# Patient Record
Sex: Female | Born: 1970 | Race: White | Hispanic: No | Marital: Married | Smoking: Never smoker
Health system: Southern US, Community
[De-identification: ages and names within clinical notes are randomized; demographics above are authoritative.]

## PROBLEM LIST (undated history)

## (undated) DIAGNOSIS — S62617D Displaced fracture of proximal phalanx of left little finger, subsequent encounter for fracture with routine healing: Principal | ICD-10-CM

## (undated) DIAGNOSIS — E079 Disorder of thyroid, unspecified: Secondary | ICD-10-CM

## (undated) DIAGNOSIS — K219 Gastro-esophageal reflux disease without esophagitis: Secondary | ICD-10-CM

---

## 2003-12-08 HISTORY — PX: CHOLECYSTECTOMY: SHX55

## 2007-12-08 HISTORY — PX: ABDOMINAL HYSTERECTOMY: SHX81

## 2018-05-25 ENCOUNTER — Other Ambulatory Visit: Payer: Self-pay

## 2018-05-25 ENCOUNTER — Emergency Department (HOSPITAL_BASED_OUTPATIENT_CLINIC_OR_DEPARTMENT_OTHER)
Admission: EM | Admit: 2018-05-25 | Discharge: 2018-05-26 | Disposition: A | Discharge status: Home / Self Care | Payer: BLUE CROSS/BLUE SHIELD | Attending: Emergency Medicine | Admitting: Emergency Medicine

## 2018-05-25 ENCOUNTER — Encounter (HOSPITAL_BASED_OUTPATIENT_CLINIC_OR_DEPARTMENT_OTHER): Payer: Self-pay

## 2018-05-25 ENCOUNTER — Emergency Department (HOSPITAL_BASED_OUTPATIENT_CLINIC_OR_DEPARTMENT_OTHER): Payer: BLUE CROSS/BLUE SHIELD

## 2018-05-25 DIAGNOSIS — T17308A Unspecified foreign body in larynx causing other injury, initial encounter: Secondary | ICD-10-CM

## 2018-05-25 DIAGNOSIS — K449 Diaphragmatic hernia without obstruction or gangrene: Secondary | ICD-10-CM | POA: Insufficient documentation

## 2018-05-25 DIAGNOSIS — K222 Esophageal obstruction: Secondary | ICD-10-CM | POA: Diagnosis not present

## 2018-05-25 DIAGNOSIS — Z79899 Other long term (current) drug therapy: Secondary | ICD-10-CM | POA: Insufficient documentation

## 2018-05-25 DIAGNOSIS — T18128A Food in esophagus causing other injury, initial encounter: Secondary | ICD-10-CM | POA: Diagnosis not present

## 2018-05-25 DIAGNOSIS — R0989 Other specified symptoms and signs involving the circulatory and respiratory systems: Secondary | ICD-10-CM | POA: Diagnosis present

## 2018-05-25 DIAGNOSIS — X58XXXA Exposure to other specified factors, initial encounter: Secondary | ICD-10-CM | POA: Insufficient documentation

## 2018-05-25 DIAGNOSIS — E039 Hypothyroidism, unspecified: Secondary | ICD-10-CM | POA: Diagnosis not present

## 2018-05-25 DIAGNOSIS — Y929 Unspecified place or not applicable: Secondary | ICD-10-CM | POA: Diagnosis not present

## 2018-05-25 DIAGNOSIS — Y999 Unspecified external cause status: Secondary | ICD-10-CM | POA: Diagnosis not present

## 2018-05-25 DIAGNOSIS — Y9389 Activity, other specified: Secondary | ICD-10-CM | POA: Diagnosis not present

## 2018-05-25 HISTORY — DX: Disorder of thyroid, unspecified: E07.9

## 2018-05-25 HISTORY — DX: Gastro-esophageal reflux disease without esophagitis: K21.9

## 2018-05-25 NOTE — ED Notes (Signed)
Patient reports possible food bolus (steak); denies any SHOB; confirms emesis.

## 2018-05-25 NOTE — ED Triage Notes (Signed)
Pt thinks a piece of steak is stuck in throat, occurred around 1830, has not been able to eat or drink anything since then

## 2018-05-25 NOTE — ED Notes (Signed)
ED Provider at bedside. 

## 2018-05-25 NOTE — ED Provider Notes (Signed)
[facility] EMERGENCY DEPARTMENT Provider Note   CSN: [id number] Arrival date & time: 05/25/18  2157     History   Chief Complaint Chief Complaint  Patient presents with  . Swallowed Foreign Body    HPI Tanya Donovan is a 47 y.o. female.  The history is provided by the patient. No language interpreter was used.  Sore Throat  This is a new problem. The current episode started 1 to 2 hours ago. The problem occurs constantly. The problem has not changed since onset.Pertinent negatives include no chest pain, no abdominal pain, no headaches and no shortness of breath. Nothing aggravates the symptoms. Nothing relieves the symptoms. She has tried nothing for the symptoms. The treatment provided no relief.    Past Medical History:  Diagnosis Date  . GERD (gastroesophageal reflux disease)   . Thyroid disease    hypothyroidism    There are no active problems to display for this patient.   Past Surgical History:  Procedure Laterality Date  . ABDOMINAL HYSTERECTOMY  2009  . CESAREAN SECTION  1995  . CHOLECYSTECTOMY  2005     OB History   None      Home Medications    Prior to Admission medications   Medication Sig Start Date End Date Taking? Authorizing Provider  escitalopram (LEXAPRO) 10 MG tablet Take 10 mg by mouth daily.   Yes [provider]  levothyroxine (SYNTHROID, LEVOTHROID) 75 MCG tablet Take 75 mcg by mouth daily.   Yes [provider]    Family History No family history on file.  Social History Social History   Tobacco Use  . Smoking status: Never Smoker  . Smokeless tobacco: Never Used  Substance Use Topics  . Alcohol use: Yes    Comment: occasion  . Drug use: Not on file     Allergies   Patient has no known allergies.   Review of Systems Review of Systems  Constitutional: Negative for chills, fatigue and fever.  HENT: Positive for trouble swallowing. Negative for congestion.   Eyes: Negative for visual  disturbance.  Respiratory: Positive for choking. Negative for apnea, cough, chest tightness, shortness of breath, wheezing and stridor.   Cardiovascular: Negative for chest pain.  Gastrointestinal: Negative for abdominal pain, constipation, diarrhea, nausea and vomiting.  Genitourinary: Negative for dysuria, flank pain and frequency.  Musculoskeletal: Negative for back pain.  Skin: Negative for rash and wound.  Neurological: Negative for light-headedness, numbness and headaches.  All other systems reviewed and are negative.    Physical Exam Updated Vital Signs BP (!) 154/99 (BP Location: Left Arm)   Pulse 89   Temp 99.4 F (37.4 C) (Oral)   Resp 18   Ht 5\' 6"  (1.676 m)   Wt 104.3 kg (230 lb)   SpO2 98%   BMI 37.12 kg/m   Physical Exam  Constitutional: She is oriented to person, place, and time. She appears well-developed and well-nourished. No distress.  HENT:  Head: Normocephalic and atraumatic.  Nose: Nose normal.  Mouth/Throat: Oropharynx is clear and moist. No oropharyngeal exudate.  Eyes: No scleral icterus.  Neck: Normal range of motion. Neck supple.  Cardiovascular: Normal rate and regular rhythm.  No murmur heard. Pulmonary/Chest: Effort normal and breath sounds normal. No stridor. No respiratory distress. She has no rales. She exhibits no tenderness.  Abdominal: Soft. There is no tenderness.  Musculoskeletal: She exhibits no edema.  Lymphadenopathy:    She has no cervical adenopathy.  Neurological: She is alert and oriented  to person, place, and time.  Skin: Skin is warm and dry. No rash noted. She is not diaphoretic. No erythema.  Psychiatric: She has a normal mood and affect.  Nursing note and vitals reviewed.    ED Treatments / Results  Labs (all labs ordered are listed, but only abnormal results are displayed) Labs Reviewed - No data to display  EKG None  Radiology Dg Neck Soft Tissue  Result Date: 05/25/2018 CLINICAL DATA:  Choked on steak while  laughing. EXAM: NECK SOFT TISSUES - 1+ VIEW COMPARISON:  None. FINDINGS: There is no evidence of retropharyngeal soft tissue swelling or epiglottic enlargement. Abnormal soft tissue density at level of the larynx. No radiopaque foreign bodies. IMPRESSION: Soft tissue fullness at the larynx, potential foreign body. Recommend direct inspection. Acute findings discussed with and reconfirmed by Dr.CHRISTOPHER TEGELER on 05/25/2018 at 10:44 pm. Electronically Signed   By: Awilda Metro M.D.   On: 05/25/2018 22:44    Procedures Procedures (including critical care time)  Medications Ordered in ED Medications - No data to display   Initial Impression / Assessment and Plan / ED Course  I have reviewed the triage vital signs and the nursing notes.  Pertinent labs & imaging results that were available during my care of the patient were reviewed by me and considered in my medical decision making (see chart for details).     Tanya Donovan is a 47 y.o. female with a past medical history for thyroid disease who presents for food bolus.  Patient reports that she was enjoying a steak dinner when she left too hard and tripped on a piece of steak.  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  She has a history of choking on things in the past but has never had to have something removed.  She typically vomits and it comes back up.  She says that medially following she tried to lay down and she had some shortness of breath with it.  She says that sitting up she does not have this problem.  She reports that she has not been able to tolerate any secretions and is spitting up all of her saliva since onset.  She reported that this happened in the last few hours.  She reports there is some discomfort in her lower neck but denies chest pain.  She denies shortness of breath, wheezing, or stridor.  She denies any other complaints or preceding symptoms.    On exam, patient had no stridor.   Lungs were clear and chest was nontender.  Oropharyngeal exam was unremarkable.  Patient had no tenderness on her neck on my exam.  Patient had normal neck range of motion.    An x-ray of the neck was performed in triage and it was discovered that she has a soft tissue fullness at the larynx concerning for foreign body.  They recommended direct inspection.  Given the patient's pain in the neck and not in the chest, the x-ray showing concern for laryngeal foreign body, and the patient's report that she both has intolerance of secretions and the previous difficulty breathing, I am concerned that she has a more proximal food bolus at this time.    Gastroenterology was called who feels she needs to be transferred to [facility] so she can go to the operating room and have anesthesia involvement to remove the food bolus.  I do not think this is an otolaryngology problem initially however GI feels  that the operating will be more appropriate if they need to get involved.  The patient requested personal vehicle transport however given the proximal location that her pupils may be, we feel she needs to go by  EMS transport.  Dr. Madilyn Hookees in the emergency department at [facility] accepted the patient in transfer and they will contact the GI team on her arrival.  Patient agrees with plan of care and will be transferred for further management.   Final Clinical Impressions(s) / ED Diagnoses   Final diagnoses:  Choking, initial encounter  Food impaction of esophagus, initial encounter    Clinical Impression: 1. Choking, initial encounter   2. Food impaction of esophagus, initial encounter     Disposition: [facility] food bolus removal by gastroenterology  This note was prepared with assistance of Dragon voice recognition software. Occasional wrong-word or sound-a-like substitutions may have occurred due to the inherent limitations of voice recognition software.     Tegeler, Canary Brimhristopher J,  MD 05/25/18 [id number]

## 2018-05-26 ENCOUNTER — Encounter (HOSPITAL_COMMUNITY)
Admission: EM | Disposition: A | Discharge status: Home / Self Care | Payer: Self-pay | Source: Home / Self Care | Attending: Emergency Medicine

## 2018-05-26 ENCOUNTER — Encounter (HOSPITAL_COMMUNITY): Payer: Self-pay

## 2018-05-26 HISTORY — PX: ESOPHAGOGASTRODUODENOSCOPY (EGD) WITH PROPOFOL: SHX5813

## 2018-05-26 SURGERY — ESOPHAGOGASTRODUODENOSCOPY (EGD) WITH PROPOFOL
Anesthesia: Monitor Anesthesia Care

## 2018-05-26 MED ORDER — DIPHENHYDRAMINE HCL 50 MG/ML IJ SOLN
INTRAMUSCULAR | Status: AC
  Filled 2018-05-26: qty 1

## 2018-05-26 MED ORDER — DIPHENHYDRAMINE HCL 50 MG/ML IJ SOLN
INTRAMUSCULAR | Status: DC | PRN
  Administered 2018-05-26: 25 mg via INTRAVENOUS

## 2018-05-26 MED ORDER — MIDAZOLAM HCL 5 MG/5ML IJ SOLN
INTRAMUSCULAR | Status: DC | PRN
  Administered 2018-05-26: 1 mg via INTRAVENOUS
  Administered 2018-05-26 (×2): 2 mg via INTRAVENOUS

## 2018-05-26 MED ORDER — MIDAZOLAM HCL 5 MG/ML IJ SOLN
INTRAMUSCULAR | Status: AC
Start: 2018-05-26 — End: ?
  Filled 2018-05-26: qty 3

## 2018-05-26 MED ORDER — FENTANYL CITRATE (PF) 100 MCG/2ML IJ SOLN
INTRAMUSCULAR | Status: AC
  Filled 2018-05-26: qty 4

## 2018-05-26 MED ORDER — FENTANYL CITRATE (PF) 100 MCG/2ML IJ SOLN
INTRAMUSCULAR | Status: DC | PRN
  Administered 2018-05-26 (×2): 25 ug via INTRAVENOUS

## 2018-05-26 SURGICAL SUPPLY — 14 items

## 2018-05-26 NOTE — ED Provider Notes (Signed)
Patient arrived after being transferred for food bolus impaction.  Concerned that patient may have a more proximal food bolus impaction as she has pain in her neck area and x-ray with some soft tissue fullness.  GI requested patient to be transferred here.  Here patient is currently stable.  No respiratory distress or stridor.  Anesthesia is not available at this time due to an emergent heart procedures so they will proceed in the emergency room with endoscopy.  6:22 AM Patient underwent upper endoscopy without complication.  No food bolus was left with only some mild residual.  Patient tolerated the procedure well.  She is here alone so will have to allow medications to wear off before she is safe for discharge.   Gwyneth SproutPlunkett, Tanya Schlottman, MD 05/26/18 [phone]

## 2018-05-26 NOTE — ED Notes (Signed)
Patient ambulated in hallway without assistance. A&O x 4.

## 2018-05-26 NOTE — Op Note (Signed)
[facility] Patient Name: Tanya Donovan Procedure Date : 05/26/2018 MRN: [id number] Attending MD: Vida Rigger , MD Date of Birth: 1971-05-03 CSN: [id number] Age: 47 Admit Type: Emergency Department Procedure:                Upper GI endoscopy Indications:              Foreign body in the esophagus Providers:                Vida Rigger, MD, Priscella Mann, RN, Madalyn Rob, Technician Referring MD:              Medicines:                Fentanyl 50 micrograms IV, Midazolam 5 mg IV,                            Diphenhydramine 25 mg IV Complications:            No immediate complications. Estimated Blood Loss:     Estimated blood loss: none. Procedure:                Pre-Anesthesia Assessment:                           - Prior to the procedure, a History and Physical                            was performed, and patient medications and                            allergies were reviewed. The patient's tolerance of                            previous anesthesia was also reviewed. The risks                            and benefits of the procedure and the sedation                            options and risks were discussed with the patient.                            All questions were answered, and informed consent                            was obtained. Prior Anticoagulants: The patient has                            taken no previous anticoagulant or antiplatelet                            agents. ASA Grade Assessment: II - A patient with  mild systemic disease. After reviewing the risks                            and benefits, the patient was deemed in                            satisfactory condition to undergo the procedure.                           After obtaining informed consent, the endoscope was                            passed under direct vision. Throughout the                            procedure, the  patient's blood pressure, pulse, and                            oxygen saturations were monitored continuously. The                            [id number] ([id number]) scope was introduced through the                            mouth, and advanced to the duodenal bulb. The upper                            GI endoscopy was accomplished without difficulty.                            The patient tolerated the procedure well. Scope In: Scope Out: Findings:      A small hiatal hernia was present.      One benign-appearing, intrinsic mild stenosis was found. The stenosis       was traversed.      A small amount of food (residue) was found in the gastric fundus.      The duodenal bulb was normal.      The exam was otherwise without abnormality. Impression:               - Small hiatal hernia.                           - Benign-appearing esophageal stenosis.                           - A small amount of food (residue) in the stomach.                           - Normal duodenal bulb.                           - The examination was otherwise normal.                           - No specimens collected. Moderate Sedation:  Moderate (conscious) sedation was administered by the endoscopy nurse       and supervised by the endoscopist. The following parameters were       monitored: oxygen saturation, heart rate, blood pressure, respiratory       rate, EKG, adequacy of pulmonary ventilation, and response to care. Recommendation:           - Patient has a contact number available for                            emergencies. The signs and symptoms of potential                            delayed complications were discussed with the                            patient. Return to normal activities tomorrow.                            Written discharge instructions were provided to the                            patient.                           - Clear liquid diet for 2 hours. Then soft solids                             and chew your food well and drink plenty of liquids                            and take her time while eating                           - Continue present medications.add Prilosec OTC for                            2 weeks and use when necessary                           - Return to GI clinic in 2-3 weeks. to discuss                            possible dilation or longer medicine therapy                           - Telephone GI clinic if symptomatic PRN. Procedure Code(s):        --- Professional ---                           [phone], Esophagogastroduodenoscopy, flexible,                            transoral; diagnostic, including collection of  specimen(s) by brushing or washing, when performed                            (separate procedure) Diagnosis Code(s):        --- Professional ---                           K44.9, Diaphragmatic hernia without obstruction or                            gangrene                           K22.2, Esophageal obstruction                           T18.108A, Unspecified foreign body in esophagus                            causing other injury, initial encounter CPT copyright 2017 [employer] Medical Association. All rights reserved. The codes documented in this report are preliminary and upon coder review may  be revised to meet current compliance requirements. Vida Rigger, MD 05/26/2018 1:52:15 AM This report has been signed electronically. Number of Addenda: 0

## 2018-05-26 NOTE — Consult Note (Signed)
Reason for Consult:fourth impaction Referring Physician: ER physician  Tanya StarksSondra Donovan is an 47 y.o. female.  HPI: patient visiting from out of town conference had state caught in her throat and went to the ER and she was spitting and had symptoms compatible with a food impaction and we were called for urgent endoscopy she is had some minor food impactions in the past which have always passed readily and otherwise does not have any heartburn or indigestion and has not had a previous endoscopy  Past Medical History:  Diagnosis Date  . GERD (gastroesophageal reflux disease)   . Thyroid disease    hypothyroidism    Past Surgical History:  Procedure Laterality Date  . ABDOMINAL HYSTERECTOMY  2009  . CESAREAN SECTION  1995  . CHOLECYSTECTOMY  2005    History reviewed. No pertinent family history.  Social History:  reports that she has never smoked. She has never used smokeless tobacco. She reports that she drinks alcohol. Her drug history is not on file.  Allergies: No Known Allergies  Medications: I have reviewed the patient's current medications.  No results found for this or any previous visit (from the past 48 hour(s)).  Dg Neck Soft Tissue  Result Date: 05/25/2018 CLINICAL DATA:  Choked on steak while laughing. EXAM: NECK SOFT TISSUES - 1+ VIEW COMPARISON:  None. FINDINGS: There is no evidence of retropharyngeal soft tissue swelling or epiglottic enlargement. Abnormal soft tissue density at level of the larynx. No radiopaque foreign bodies. IMPRESSION: Soft tissue fullness at the larynx, potential foreign body. Recommend direct inspection. Acute findings discussed with and reconfirmed by Dr.CHRISTOPHER TEGELER on 05/25/2018 at 10:44 pm. Electronically Signed   By: Awilda Metroourtnay  Bloomer M.D.   On: 05/25/2018 22:44    ROS negative except above Blood pressure (!) 137/104, pulse 80, temperature 98.9 F (37.2 C), temperature source Oral, resp. rate 18, height 5\' 6"  (1.676 m), weight 104.3 kg  (230 lb), SpO2 96 %. Physical Examvital signs stable afebrile exam please see preassessment evaluation  Assessment/Plan: Food impaction Plan: The risks benefits methods of endoscopy with removal of the fever proceeding into the stomach was discussed with the patient and will proceed this evening ASAP with further workup and plans pending those findings  Tanya Donovan E 05/26/2018, 1:34 AM

## 2018-05-26 NOTE — Discharge Instructions (Signed)
Liquids only for 2 hours and then if okay soft solids today and chew your food well and eat slowly and drink plenty of liquids and see her local gastroenterologist over the next few weeks to decide if a dilation or medical therapy is needed and use Prilosec over-the-counter once a day as needed and probably use for the next 2 weeks

## 2018-05-26 NOTE — ED Notes (Signed)
Report given to: Tanna SavoySarah Wallace, RN at [facility] emergency department.

## 2018-05-26 NOTE — Progress Notes (Signed)
Bedside report given to Katie, RN.

## 2019-02-04 IMAGING — CR DG NECK SOFT TISSUE
2 series · 2 of 2 positions shown · non-contrast
Comparison: None.

CLINICAL DATA: Choked on steak while laughing.

EXAM:
NECK SOFT TISSUES - 1+ VIEW

[w soft tissue neck]
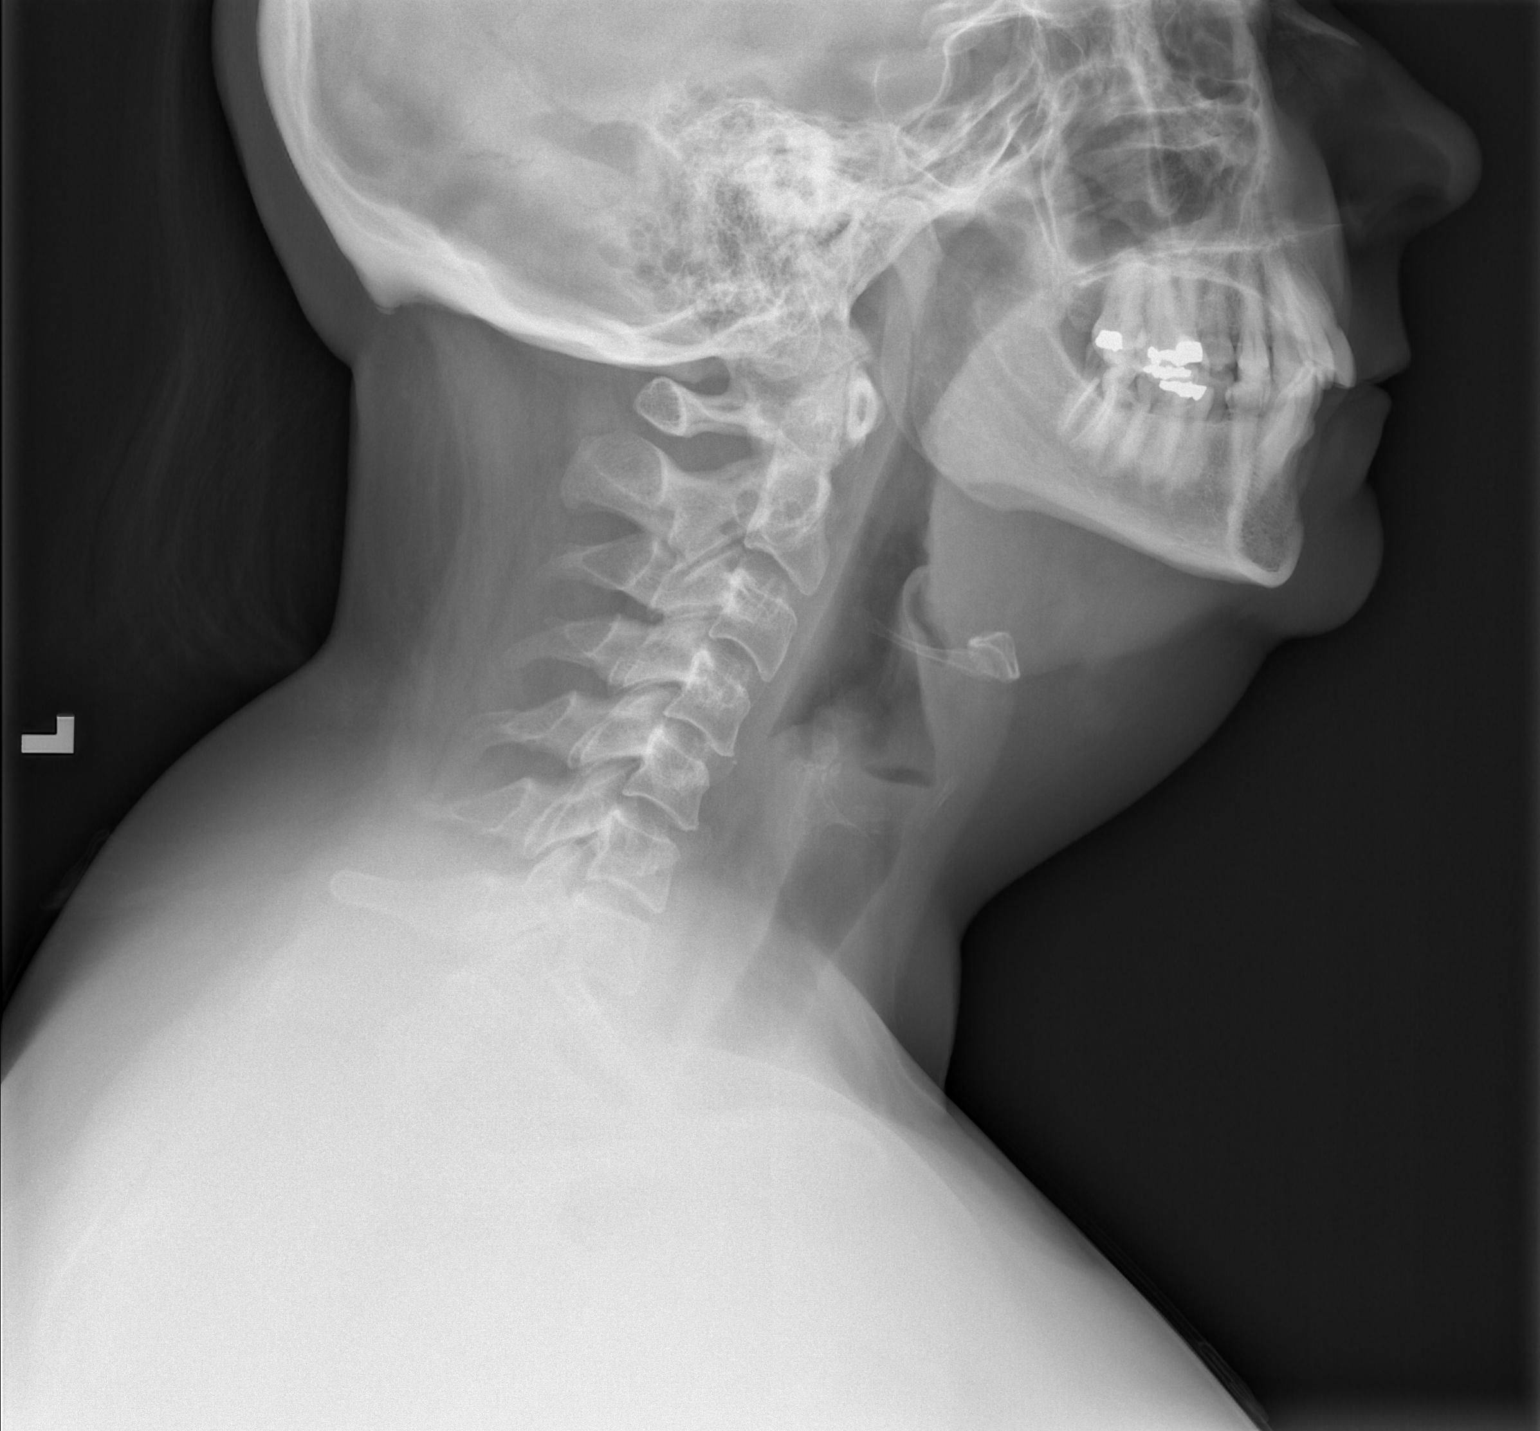

[w soft tissue neck ap]
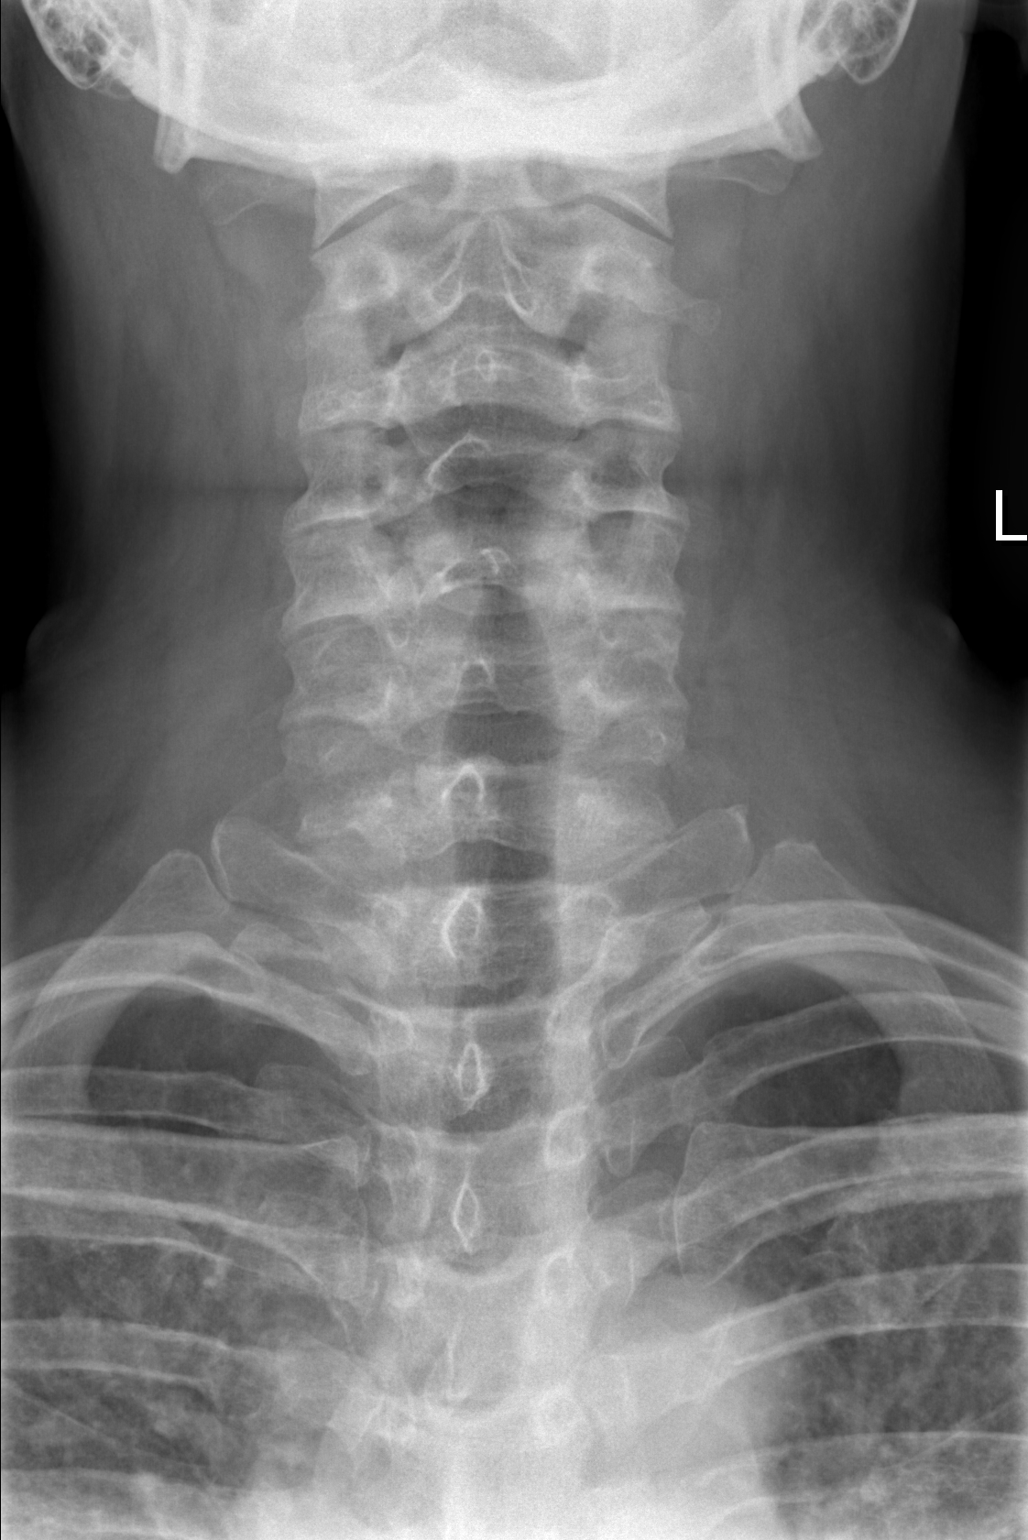

[2 of 2 positions shown; findings below may reference images not displayed]

FINDINGS: There is no evidence of retropharyngeal soft tissue swelling or
epiglottic enlargement. Abnormal soft tissue density at level of the
larynx. No radiopaque foreign bodies.
IMPRESSION: Soft tissue fullness at the larynx, potential foreign body.
Recommend direct inspection.

Acute findings discussed with and reconfirmed by Dr.STEIDEL

## 2022-02-15 DIAGNOSIS — I160 Hypertensive urgency: Secondary | ICD-10-CM

## 2022-02-15 DIAGNOSIS — R519 Headache, unspecified: Secondary | ICD-10-CM

## 2022-02-15 NOTE — ED Provider Notes (Signed)
[facility]  EMERGENCY DEPARTMENT ENCOUNTER      Pt Name: Evelyn Cordova  MRN: [id number]  Birthdate Mar 27, 1971  Date of evaluation: 02/15/2022  Provider: Achilles Dunk, MD    CHIEF COMPLAINT       Chief Complaint   Patient presents with    Hypertension     Patient states she has had a headache and hypertension, state she has "felt bad' since Thursday.  She states she has been ignoring her symptoms     Patient admits to having chest pain on Friday with left arm pain           HISTORY OF PRESENT ILLNESS   (Location/Symptom, Timing/Onset,Context/Setting, Quality, Duration, Modifying Factors, Severity)  Note limiting factors.   Evelyn Cordova is a 51 y.o. female who presents to the emergency department after having several symptoms over the last week.  Has had fatigue for about a week.  On Thursday started having intermittent significant headaches and noted that blood pressure was elevated.  Has a history of hypothyroidism.  Says she was on lisinopril over 20 years ago but has not required any antihypertensive medications says her blood pressure at home in the last couple days has been as high as 200 systolic.  Seems to go up suddenly with any activity and gets better at rest.  Says that the headaches seem to be sporadic but do seem to improve when she lays down flat and rest.  Says her vision has been blurred when she has had the headache.  Not having any vision changes in between the episodes of headaches.    HPI    NursingNotes were reviewed.    REVIEW OF SYSTEMS    (2-9 systems for level 4, 10 or more for level 5)     Review of Systems   Constitutional:  Positive for fatigue.   HENT: Negative.     Eyes: Negative.    Respiratory: Negative.     Cardiovascular:  Positive for chest pain.   Gastrointestinal: Negative.    Genitourinary: Negative.    Musculoskeletal: Negative.    Skin: Negative.    Neurological:  Positive for headaches.   Hematological: Negative.    Psychiatric/Behavioral: Negative.       A complete  review of systems was performed and is negative except as noted above in the HPI.       PAST MEDICAL HISTORY     Past Medical History:   Diagnosis Date    Hypothyroidism          SURGICAL HISTORY       Past Surgical History:   Procedure Laterality Date    CESAREAN SECTION      CHOLECYSTECTOMY      HYSTERECTOMY (CERVIX STATUS UNKNOWN)           CURRENT MEDICATIONS       Discharge Medication List as of 02/16/2022  5:29 AM        CONTINUE these medications which have NOT CHANGED    Details   levothyroxine (SYNTHROID) 137 MCG tablet Take 137 mcg by mouth DailyHistorical Med             ALLERGIES     Patient has no known allergies.    FAMILY HISTORY     History reviewed. No pertinent family history.       SOCIAL HISTORY       Social History     Socioeconomic History    Marital status: Married  Spouse name: None    Number of children: None    Years of education: None    Highest education level: None   Tobacco Use    Smoking status: Never     Passive exposure: Never    Smokeless tobacco: Never   Substance and Sexual Activity    Alcohol use: Yes     Comment: social    Drug use: Never       SCREENINGS    Glasgow Coma Scale  Eye Opening: Spontaneous  Best Verbal Response: Oriented  Best Motor Response: Obeys commands  Glasgow Coma Scale Score: 15        PHYSICAL EXAM    (up to 7 for level 4, 8 or more for level 5)     ED Triage Vitals [02/15/22 2337]   BP Temp Temp Source Heart Rate Resp SpO2 Height Weight   (!) 202/124 98.3 F (36.8 C) Temporal 92 18 99 % 5\' 6"  (1.676 m) 275 lb (124.7 kg)       Physical Exam  Vitals and nursing note reviewed.   Constitutional:       General: She is not in acute distress.     Appearance: She is well-developed. She is not toxic-appearing or diaphoretic.   HENT:      Head: Normocephalic and atraumatic.   Eyes:      General: No scleral icterus.        Right eye: No discharge.         Left eye: No discharge.      Pupils: Pupils are equal, round, and reactive to light.   Cardiovascular:       Rate and Rhythm: Normal rate and regular rhythm.      Pulses: Normal pulses.      Heart sounds: Normal heart sounds.   Pulmonary:      Effort: Pulmonary effort is normal. No respiratory distress.      Breath sounds: Normal breath sounds. No stridor. No wheezing or rhonchi.   Abdominal:      General: There is no distension.   Musculoskeletal:         General: No deformity. Normal range of motion.      Cervical back: Normal range of motion.      Right lower leg: No edema.      Left lower leg: No edema.   Skin:     General: Skin is warm and dry.   Neurological:      General: No focal deficit present.      Mental Status: She is alert and oriented to person, place, and time.      GCS: GCS eye subscore is 4. GCS verbal subscore is 5. GCS motor subscore is 6.      Cranial Nerves: No cranial nerve deficit.      Sensory: Sensation is intact.      Motor: Motor function is intact. No weakness or abnormal muscle tone.      Coordination: Coordination is intact.   Psychiatric:         Behavior: Behavior normal.         Thought Content: Thought content normal.         Judgment: Judgment normal.       DIAGNOSTIC RESULTS     EKG: All EKG's are interpreted by the Emergency Department Physician who either signs or Co-signs this chart in the absence of a cardiologist.      RADIOLOGY:   Non-plain film images such as  CT, Ultrasound and MRI are read by the radiologist. Plainradiographic images are visualized and preliminarily interpreted by the emergency physician with the below findings:        Interpretation per the Radiologist below, if available at the time of this note:    CT HEAD WO CONTRAST   Final Result   No acute hemorrhage, hydrocephalus, or mass effect.Electronically signed by Lawerance Bach, MD on 02-16-22 at 0330      XR CHEST PORTABLE   Final Result   Normal chest x-ray.Electronically signed by Bonner Puna, MD on 02-16-22 at 0041            ED BEDSIDE ULTRASOUND:   Performed by ED Physician - none    LABS:  Labs Reviewed    COMPREHENSIVE METABOLIC PANEL - Abnormal; Notable for the following components:       Result Value    Glucose 121 (*)     Alkaline Phosphatase 160 (*)     ALT 41 (*)     All other components within normal limits   CBC WITH AUTO DIFFERENTIAL - Abnormal; Notable for the following components:    MCHC 31.6 (*)     MPV 9.3 (*)     All other components within normal limits   TSH WITH REFLEX TO FT4 - Abnormal; Notable for the following components:    TSH Reflex FT4 12.62 (*)     All other components within normal limits   URINALYSIS - Abnormal; Notable for the following components:    Color, UA DARK YELLOW (*)     Ketones, Urine TRACE (*)     Protein, UA 30 (*)     Leukocyte Esterase, Urine TRACE (*)     All other components within normal limits   MICROSCOPIC URINALYSIS - Abnormal; Notable for the following components:    Bacteria, UA NEGATIVE (*)     Crystals, UA NEG (*)     Hyaline Casts, UA 14 (*)     WBC, UA 20 (*)     All other components within normal limits   TROPONIN   BRAIN NATRIURETIC PEPTIDE   T4, FREE   METANEPHRINES URINE   METANEPHRINES PLASMA FREE       All other labs were within normal range or not returned as of this dictation.    EMERGENCY DEPARTMENT COURSE and DIFFERENTIALDIAGNOSIS/MDM:   Vitals:    Vitals:    02/16/22 0359 02/16/22 0434 02/16/22 0500 02/16/22 0532   BP: 139/83 129/81 115/70 131/86   Pulse: 62 81 57 67   Resp: 17 21 15 18    Temp:       TempSrc:       SpO2: 94% 96%  93%   Weight:       Height:           MDM    ED Course as of 02/16/22 0604   Mon Feb 16, 2022   0005 EKG shows sinus rhythm with a rate of 79.  Nonspecific flat T waves in lateral leads.  No ST elevation or depression.  QRS and QTc intervals normal [JP]   0254 T4 Free: 1.06 [JP]      ED Course User Index  [JP] Achilles Dunk., MD     Patient significantly hypertensive on arrival here.  Symptoms including headache seem to correlate with elevated blood pressure reading.  Laboratory evaluation overall unremarkable.  TSH  abnormal, however T4 within normal limits.  Patient on Synthroid.  No significant electrolyte derangements.  No findings of cardiac ischemia or other endorgan damage.  Noncontrast head CT shows no mass lesions, hemorrhage, stroke, or other concerning findings.  After treatment here, symptoms resolved.  Blood pressure improved significantly.  Ambulatory without difficulty.  Sudden, sharp rise in blood pressure and other symptoms described, metanephrine levels sent to be followed up.  Plan to start low-dose amlodipine for hypertension and headaches along with as needed clonidine.  Advised patient to follow-up with primary care doctor within the next week or so and discussed return precautions    Evaluation and work-up here revealed no signs of emergent or life-threatening pathology that would necessitate admission for further work-up or management at this time.  Patient is felt to be stable for discharge home with return precautions for worsening of the condition or development of new concerning symptoms.  Patient was encouraged to follow-up with their primary care doctor in the appropriate timeframe.  Necessary prescriptions and information have been provided for treatment at home.  Patient voices understanding and agreement with the plan.    CONSULTS:  None    PROCEDURES:  Unless otherwise notedbelow, none     Procedures    FINAL IMPRESSION     1. Acute nonintractable headache, unspecified headache type    2. Hypertensive urgency          DISPOSITION/PLAN   DISPOSITION Decision To Discharge 02/16/2022 04:26:28 AM      No notes of EC Admission Criteria type on file.    PATIENT REFERRED TO:  [facility]  [address]  [place] [address]  [phone]    If symptoms worsen    [facility]  [address]  [place] [address]  [phone]    Schedule an appointment as soon as possible for a visit       DISCHARGE MEDICATIONS:  Discharge Medication List as of 02/16/2022  5:29 AM        START  taking these medications    Details   amLODIPine (NORVASC) 2.5 MG tablet Take 1 tablet by mouth daily, Disp-30 tablet, R-0Normal      cloNIDine (CATAPRES) 0.1 MG tablet Take 1 tablet by mouth 2 times daily as needed for High Blood Pressure (Systolic >180 or diastolic >110), Disp-15 tablet, R-0Normal                (Please note that portions of this note were completed with a voice recognition program.  Efforts were made to edit the dictations butoccasionally words are mis-transcribed.)    Achilles Dunk, MD (electronically signed)  Emergency Physician          Achilles Dunk., MD  02/16/22 [phone]

## 2022-02-16 ENCOUNTER — Emergency Department: Admit: 2022-02-16 | Discharge: 2022-04-13 | Payer: BLUE CROSS/BLUE SHIELD | Primary: Family Medicine

## 2022-02-16 ENCOUNTER — Inpatient Hospital Stay
Admit: 2022-02-16 | Discharge: 2022-02-16 | Disposition: A | Discharge status: Home / Self Care | Payer: BLUE CROSS/BLUE SHIELD | Attending: Emergency Medicine

## 2022-02-16 LAB — T4, FREE: T4 Free: 1.06 ng/dL (ref 0.93–1.70)

## 2022-02-16 LAB — COMPREHENSIVE METABOLIC PANEL
ALT: 41 U/L — ABNORMAL HIGH (ref 5–33)
AST: 32 U/L (ref 5–32)
Albumin: 4 g/dL (ref 3.5–5.2)
Alkaline Phosphatase: 160 U/L — ABNORMAL HIGH (ref 35–104)
Anion Gap: 9 mmol/L (ref 7–19)
BUN: 12 mg/dL (ref 6–20)
CO2: 28 mmol/L (ref 22–29)
Calcium: 9.1 mg/dL (ref 8.6–10.0)
Chloride: 104 mmol/L (ref 98–111)
Creatinine: 0.7 mg/dL (ref 0.5–0.9)
Est, Glom Filt Rate: >60 (ref >60)
Glucose: 121 mg/dL — ABNORMAL HIGH (ref 74–109)
Potassium: 4 mmol/L (ref 3.5–5.0)
Sodium: 141 mmol/L (ref 136–145)
Total Bilirubin: 0.3 mg/dL (ref 0.2–1.2)
Total Protein: 7 g/dL (ref 6.6–8.7)

## 2022-02-16 LAB — CBC WITH AUTO DIFFERENTIAL
Basophils %: 0.7 % (ref 0.0–1.0)
Basophils Absolute: 0.1 10*3/uL (ref 0.00–0.20)
Eosinophils %: 3.6 % (ref 0.0–5.0)
Eosinophils Absolute: 0.3 10*3/uL (ref 0.00–0.60)
Hematocrit: 45.2 % (ref 37.0–47.0)
Hemoglobin: 14.3 g/dL (ref 12.0–16.0)
Immature Granulocytes #: 0 10*3/uL
Lymphocytes %: 31.4 % (ref 20.0–40.0)
Lymphocytes Absolute: 2.5 10*3/uL (ref 1.1–4.5)
MCH: 27.6 pg (ref 27.0–31.0)
MCHC: 31.6 g/dL — ABNORMAL LOW (ref 33.0–37.0)
MCV: 87.1 fL (ref 81.0–99.0)
MPV: 9.3 fL — ABNORMAL LOW (ref 9.4–12.3)
Monocytes %: 3.6 % (ref 0.0–10.0)
Monocytes Absolute: 0.3 10*3/uL (ref 0.00–0.90)
Neutrophils %: 60.5 % (ref 50.0–65.0)
Neutrophils Absolute: 4.9 10*3/uL (ref 1.5–7.5)
Platelets: 275 10*3/uL (ref 130–400)
RBC: 5.19 M/uL (ref 4.20–5.40)
RDW: 13.2 % (ref 11.5–14.5)
WBC: 8.1 10*3/uL (ref 4.8–10.8)

## 2022-02-16 LAB — TROPONIN: Troponin: <0.01 ng/mL (ref 0.00–0.03)

## 2022-02-16 LAB — URINALYSIS
Bilirubin Urine: NEGATIVE
Blood, Urine: NEGATIVE
Glucose, Ur: NEGATIVE mg/dL
Nitrite, Urine: NEGATIVE
Protein, UA: 30 mg/dL — AB
Specific Gravity, UA: 1.033 (ref 1.005–1.030)
Urobilinogen, Urine: 1 E.U./dL (ref <2.0)
pH, UA: 5.5 (ref 5.0–8.0)

## 2022-02-16 LAB — MICROSCOPIC URINALYSIS
Bacteria, UA: NEGATIVE /HPF — AB
Crystals, UA: NEGATIVE /HPF — AB
Epithelial Cells, UA: 1 /HPF (ref 0–5)
Hyaline Casts, UA: 14 /HPF — ABNORMAL HIGH (ref 0–8)
RBC, UA: 2 /HPF (ref 0–4)
WBC, UA: 20 /HPF — ABNORMAL HIGH (ref 0–5)

## 2022-02-16 LAB — BRAIN NATRIURETIC PEPTIDE: Pro-BNP: <36 pg/mL (ref 0–900)

## 2022-02-16 LAB — TSH WITH REFLEX TO FT4: TSH Reflex FT4: 12.62 u[IU]/mL — ABNORMAL HIGH (ref 0.35–5.50)

## 2022-02-16 MED ORDER — METOCLOPRAMIDE HCL 5 MG/ML IJ SOLN
5 MG/ML | Freq: Once | INTRAMUSCULAR | Status: AC
Start: 2022-02-16 — End: 2022-02-16
  Administered 2022-02-16: 07:00:00 10 mg via INTRAVENOUS

## 2022-02-16 MED ORDER — CLONIDINE HCL 0.1 MG PO TABS
0.1 | ORAL_TABLET | Freq: Two times a day (BID) | ORAL | 0 refills | Status: DC | PRN
Start: 2022-02-16 — End: 2024-08-23

## 2022-02-16 MED ORDER — AMLODIPINE BESYLATE 2.5 MG PO TABS
2.5 | ORAL_TABLET | Freq: Every day | ORAL | 0 refills | Status: DC
Start: 2022-02-16 — End: 2024-08-23

## 2022-02-16 MED ORDER — LABETALOL HCL 5 MG/ML IV SOLN
5 MG/ML | Freq: Once | INTRAVENOUS | Status: AC
Start: 2022-02-16 — End: 2022-02-16
  Administered 2022-02-16: 07:00:00 5 mg via INTRAVENOUS

## 2022-02-16 MED FILL — METOCLOPRAMIDE HCL 5 MG/ML IJ SOLN: 5 MG/ML | INTRAMUSCULAR | Qty: 2

## 2022-02-16 MED FILL — LABETALOL HCL 5 MG/ML IV SOLN: 5 MG/ML | INTRAVENOUS | Qty: 4

## 2022-02-16 NOTE — ED Notes (Signed)
Dr. Jeanella Craze at b/s     Charlett Nose, RN  02/16/22 [phone]

## 2022-02-16 NOTE — ED Notes (Signed)
Dr. Jeanella Craze at b/s     Charlett Nose, RN  02/16/22 [phone]

## 2022-02-17 LAB — EKG 12-LEAD
P Axis: 60 degrees
P-R Interval: 120 ms
Q-T Interval: 376 ms
QRS Duration: 82 ms
QTc Calculation (Bazett): 409 ms
T Axis: 44 degrees

## 2022-02-19 LAB — METANEPHRINES URINE
Creatinine, Ur: 475 mg/dL
Metanephrine ug/g Cre: 53 ug/g CRT (ref 0–300)
Metanephrine, Urine-Per Vol: 252 ug/L
Normetanephrine, (g/CRT): 241 ug/g CRT (ref 0–400)
Normetanephrines, nmol/L: 1146 ug/L

## 2022-02-19 LAB — METANEPHRINES PLASMA FREE
Metanephrine, Free: <0.1 nmol/L (ref 0.00–0.49)
Normetanephrine, Free: 0.34 nmol/L (ref 0.00–0.89)

## 2024-08-22 NOTE — Telephone Encounter (Signed)
 Fx Sheet  Fx Lt 5th finger  DOI 9-16  [place] 9-16  Yes X ray 9-16   Pt getting disc  Pt is in a splint  Yes this is WC

## 2024-08-22 NOTE — Telephone Encounter (Signed)
 Please forward to hand clinic

## 2024-08-22 NOTE — ED Provider Notes (Signed)
 History        Chief Complaint   Patient presents with   . Finger Injury       53 year old female with past medical history of Hashimoto's disease and hypertension presents for evaluation of a finger injury.  Patient states she was walking up some stairs when she slipped.  She states she tried to catch herself and she injured her left small finger.  She states I think it might be dislocated.  No treatment prior to arrival.  Pain is worse with movement and better with rest.    The history is provided by the patient. No language interpreter was used.       Past Medical History:   Diagnosis Date   . Disease of thyroid gland    . Hashimoto's disease    . Hypertension        Past Surgical History:   Procedure Laterality Date   . CESAREAN SECTION     . GALLBLADDER SURGERY     . HYSTERECTOMY         No family history on file.    Social History     Tobacco Use   . Smoking status: Never   . Smokeless tobacco: Never   Substance and Sexual Activity   . Alcohol use: Never   . Drug use: Never   . Sexual activity: Not on file       Prior to Admission medications   Medication Sig Start Date End Date Taking? Authorizing Provider   HYDROcodone -acetaminophen  (NORCO) 5-325 mg tablet Take one tablet by mouth every 6 (six) hours as needed for pain. 08/22/24 09/01/24  Verneita JAYSON Dolly, NP   hydroxychloroquine (PLAQUENIL) 200 mg tablet Take by mouth.    Historical Him Provider   levothyroxine (SYNTHROID) 150 MCG tablet Take one tablet (150 mcg total) by mouth one (1) time a day.    Historical Him Provider   lisinopriL (PRINIVIL) 40 MG tablet Take one tablet (40 mg total) by mouth one (1) time a day.    Historical Him Provider       Review of Systems   Constitutional:  Negative for fever and unexpected weight change.   HENT:  Negative for hearing loss and nosebleeds.    Eyes:  Negative for pain and visual disturbance.   Respiratory:  Negative for cough, chest tightness and shortness of breath.    Cardiovascular:  Negative for chest pain.    Gastrointestinal:  Negative for abdominal pain.   Genitourinary:  Negative for dysuria and hematuria.   Musculoskeletal:  Positive for arthralgias. Negative for myalgias.   Skin:  Negative for color change.   Neurological:  Negative for seizures, syncope and weakness.   All other systems reviewed and are negative.        Physical Exam   Initial Vitals:  ED Triage Vitals [08/22/24 1023]  BP: 127/79  Pulse: 84  Resp: 18  Temp: 98.4 F (36.9 C)  Temp src: n/a  SpO2: 96 %    Vitals:  BP 130/77   Pulse 82   Temp 98.4 F (36.9 C)   Resp 18   Ht 5' 5 (1.651 m)   Wt 127 kg (280 lb)   SpO2 98%   BMI 46.59 kg/m     Physical Exam  Vitals and nursing note reviewed.   Constitutional:       General: She is not in acute distress.     Appearance: Normal appearance. She is well-developed. She is obese.  HENT:      Head: Normocephalic and atraumatic.      Right Ear: External ear normal.      Left Ear: External ear normal.      Mouth/Throat:      Mouth: Mucous membranes are moist.      Pharynx: No oropharyngeal exudate.   Eyes:      General: No scleral icterus.     Extraocular Movements: Extraocular movements intact.      Conjunctiva/sclera: Conjunctivae normal.   Neck:      Thyroid: No thyromegaly.      Trachea: No tracheal deviation.   Cardiovascular:      Rate and Rhythm: Normal rate and regular rhythm.      Pulses: Normal pulses.      Heart sounds: Normal heart sounds.   Pulmonary:      Effort: Pulmonary effort is normal. No respiratory distress.      Breath sounds: Normal breath sounds.   Abdominal:      General: Abdomen is flat.      Palpations: Abdomen is soft. There is no mass.      Tenderness: There is no abdominal tenderness.   Musculoskeletal:         General: Tenderness, deformity and signs of injury present. No swelling.      Cervical back: Normal range of motion and neck supple.   Skin:     General: Skin is warm and dry.      Capillary Refill: Capillary refill takes less than 2 seconds.      Coloration:  Skin is not pale.   Neurological:      General: No focal deficit present.      Mental Status: She is alert and oriented to person, place, and time. Mental status is at baseline.   Psychiatric:         Mood and Affect: Mood normal.         Behavior: Behavior normal.           ED Course          Splint Application    Date/Time: 08/22/2024 12:00 PM    Performed by: Verneita JAYSON Dolly, NP  Authorized by: Levorn Jenkins Kipper, MD    Sedation:     Patient sedated: No    Procedure details:     $$Location:  Left small finger    Post-procedure: The splinted body part was neurovascularly unchanged following the procedure       patient tolerated the procedure well with no immediate complications     Closed reduction fracture of the 5th finger was performed by myself, neurovascular intact improved alignment noted on x-ray           Medical Decision Making  Amount and/or Complexity of Data Reviewed  Radiology: ordered.    Risk  Prescription drug management.                  ED Orders (From admission, onward)      Ordered     Status Ordering Provider    08/23/24 1134  Splint Application  Once        Comments: This order was created via procedure documentation    Ordered BENNETT, TERESA C    08/22/24 1132  XR Hand Left PA/Lat/Obl  1 time imaging         Final result BENNETT, TERESA C    08/22/24 1033  lidocaine (XYLOCAINE) 10 mg/mL (1 %) injection 10 mL  Once  Last MAR action: Given BENNETT, TERESA C    08/22/24 1023  XR Hand Left PA/Lat/Obl  1 time imaging         Final result BENNETT, TERESA C            Lab Results - No data to display    XR Hand Left PA/Lat/Obl   Final Result   08/22/2024 11:43 AM CDT      XR HAND LEFT PA LATERAL AND OBLIQUE      History:  53 years  Female       post reduction 5th digit      Reference   08/22/2024      Findings   3 views of the left hand demonstrate interval reduction and improved   alignment of the fractured fifth proximal phalanx.         IMPRESSION:   Improved alignment.               XR Hand  Left PA/Lat/Obl   Final Result   08/22/2024 10:35 AM CDT      XR HAND LEFT PA LATERAL AND OBLIQUE      History:  52 years  Female       injury      Reference   None      Findings   3 views of the left hand demonstrate a comminuted angulated fracture   of the proximal aspect of the proximal phalanx of the fifth digit.   Soft tissue swelling is identified. No additional fracture identified.         IMPRESSION:   Proximal phalanx fifth digit fracture           ED Medication Administration from 08/22/2024 1011 to 08/23/2024 1136         Date/Time Order Dose Route Action Action by     08/22/2024 1045 CDT lidocaine (XYLOCAINE) 10 mg/mL (1 %) injection 10 mL 10 mL Infiltration Given Dena Rosaline Bianchi, RN            Follow-up Provider       Follow up With Specialties Details Why Contact Info    Alejo Izetta Fellows, MD [facility]   [address] [place] [address]  [phone]     Last Edited: Tue Aug 22, 2024 12:24 PM by Verneita JAYSON Dolly, NP         ED Prescriptions (From admission, onward)      Start Ordered     Status Ordering Provider    08/22/24 0000 08/22/24 1229  HYDROcodone -acetaminophen  (NORCO) 5-325 mg tablet  Every 6 hours PRN         Ordered BENNETT, TERESA C                Discharge Instructions         Rest, ice and elevate your left hand, wear splint at all times, follow-up with orthopedist asap        Clinical Impression  Final diagnoses:   [S62.617A] Closed displaced fracture of proximal phalanx of left little finger, initial encounter            Critical Care Time     Exclusive of procedure time.                   Verneita JAYSON Dolly, NP  08/23/24 1136

## 2024-08-23 ENCOUNTER — Ambulatory Visit: Admit: 2024-08-23 | Discharge: 2024-08-23 | Payer: PRIVATE HEALTH INSURANCE | Primary: Family Medicine

## 2024-08-23 ENCOUNTER — Ambulatory Visit: Admit: 2024-08-23 | Discharge: 2024-08-29 | Payer: BLUE CROSS/BLUE SHIELD | Primary: Family Medicine

## 2024-08-23 NOTE — Progress Notes (Cosign Needed)
 Orthopaedic History and Physical - New Patient    NAME:  Evelyn Cordova   DOB: 17-Oct-1971  MRN: [id number]    08/23/2024     CHIEF COMPLAINT:  left little finger pain    HISTORY OF PRESENT ILLNESS:   The patient is a 53 y.o. 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  Pain is described as constant, achy, throbbing, associated with swelling, bruising, decreased range of motion worse with attempted activity. Treatment has consisted of x-ray imaging, reduction, splinting at [facility].  She was prescribed Norco 5-325 for pain control, which she has taken at night. Pain is rated a 8-9/10.    Past Medical History:        Diagnosis Date    Fractures     Ganglion cyst     Hypertension     Hypothyroidism        Past Surgical History:        Procedure Laterality Date    CESAREAN SECTION      CHOLECYSTECTOMY      HYSTERECTOMY (CERVIX STATUS UNKNOWN)         Current Medications:   Prior to Admission medications   Medication Sig Start Date End Date Taking? Authorizing Provider   HYDROcodone-acetaminophen (NORCO) 5-325 MG per tablet Take 1 tablet by mouth every 6 hours as needed. 08/22/24 09/02/24 Yes [provider]   hydroxychloroquine (PLAQUENIL) 200 MG tablet Take by mouth 05/09/24  Yes [provider]   lisinopril (PRINIVIL;ZESTRIL) 40 MG tablet Take 1 tablet by mouth daily 02/07/24  Yes [provider]   levothyroxine (SYNTHROID) 150 MCG tablet Take 137 mcg by mouth Daily   Yes [provider]       Allergies:  Patient has no known allergies.    Social History:   Social History     Socioeconomic History    Marital status: Married     Spouse name: Not on file    Number of children: Not on file    Years of education:  Not on file    Highest education level: Not on file   Occupational History    Not on file   Tobacco Use    Smoking status: Never     Passive exposure: Never    Smokeless tobacco: Never   Vaping Use    Vaping status: Never Used   Substance and Sexual Activity    Alcohol use: Yes     Comment: social    Drug use: Never    Sexual activity: Not on file   Other Topics Concern    Not on file   Social History Narrative    Not on file     Social Drivers of Health     Financial Resource Strain: Low Risk  (02/10/2023)    Received from [facility]    Overall Financial Resource Strain (CARDIA)     Difficulty of Paying Living Expenses: Not very hard   Food Insecurity: No Food Insecurity (02/10/2023)    Received from [facility]    Hunger Vital Sign     Within the past 12 months, you worried that your food would run out before you got the money to  buy more.: Never true     Within the past 12 months, the food you bought just didn't last and you didn't have money to get more.: Never true   Transportation Needs: No Transportation Needs (02/10/2023)    Received from [facility]    PRAPARE - Transportation     Lack of Transportation (Medical): No     Lack of Transportation (Non-Medical): No   Physical Activity: Insufficiently Active (02/10/2023)    Received from [facility]    Exercise Vital Sign     On average, how many days per week do you engage in moderate to strenuous exercise (like a brisk walk)?: 3 days     On average, how many minutes do you engage in exercise at this level?: 20 min   Stress: Not on file (10/12/2023)   Social Connections: Socially Integrated (02/10/2023)    Received from [employer]    Social Connection and Isolation Panel     In a typical week, how many times do you talk on the phone with family, friends, or neighbors?: More than three times a week     How often do you get together with friends or relatives?: Three times a week     How often do you  attend church or religious services?: More than 4 times per year     Do you belong to any clubs or organizations such as church groups, unions, fraternal or athletic groups, or school groups?: Yes     How often do you attend meetings of the clubs or organizations you belong to?: 1 to 4 times per year     Are you married, widowed, divorced, separated, never married, or living with a partner?: Married   Intimate Partner Violence: Not At Risk (02/10/2023)    Received from [employer], Afraid, Rape, and Kick questionnaire     Within the last year, have you been afraid of your partner or ex-partner?: No     Within the last year, have you been humiliated or emotionally abused in other ways by your partner or ex-partner?: No     Within the last year, have you been kicked, hit, slapped, or otherwise physically hurt by your partner or ex-partner?: No     Within the last year, have you been raped or forced to have any kind of sexual activity by your partner or ex-partner?: No   Housing Stability: Unknown (02/10/2023)    Received from [employer] Stability Vital Sign     In the last 12 months, was there a time when you were not able to pay the mortgage or rent on time?: No     Number of Times Moved in the Last Year: Not on file     Homeless in the Last Year: Not on file       Family History:   History reviewed. No pertinent family history.    Review of Systems -   System Neg/Pos Details   Constitutional Negative Fatigue and Fever.   Respiratory Negative Cough and Dyspnea.   Cardio Negative Chest pain.   GI Negative Abdominal pain and Vomiting.   GU Negative Urinary incontinence.   Neuro Negative Headache.   Psych Negative Psychiatric symptoms.       PHYSICAL EXAM:      Physical Examination:  GENERAL: No distress.  ORIENTATION: Alert and oriented to time, place, person.  MOOD AND AFFECT: Cooperative and pleasant.  GAIT EXAMINATION: Reciprocal heel-toe gait without assistive  devices.  CARDIOVASCULAR: Symmetric 2 pulses in upper and lower extremity, no significant edema noted.  LYMPHATIC: No cervical or inguinal lymphadenopathy noted.  SENSATION: Grossly intact to light touch.  DEEP TENDON REFLEXES: Normal, no pathological reflexes.  COORDINATION/BALANCE: Normal.  EXTREMITIES:  Left upper extremity exam:  Tenderness about little finger proximal phalanx with diffuse soft tissue swelling and ecchymosis that extends into palmar aspect of hand. She has full range of motion in first three digits. Limited movement in ring and little fingers with attempted flexion due to pain and effusion. No obvious extension lag. Neurovascular intact. Skin intact without signs of infection.    Right upper extremity exam:  There is no tenderness to palpation about the shoulder, elbow, wrist or hand.  Unrestricted full motion is present.  Stability is normal with provocative tests, 5/5 strength, and skin is normal.     Radiology:   XR HAND LEFT (MIN 3 VIEWS)  Result Date: 08/23/2024  AP, lateral, and oblique views of the left hand was obtained in the office on today's visit, reviewed and interpreted by me.  Images of adequate quality. Images show displaced extra-articular proximal phalanx fracture near base of little finger with overlying soft tissue swelling noted. Bone quality appears normal.     XR Hand Left PA/Lat/Obl  Result Date: 08/22/2024  08/22/2024 11:43 AM CDT XR HAND LEFT PA LATERAL AND OBLIQUE History:  54 years  Female post reduction 5th digit Reference 08/22/2024 Findings 3 views of the left hand demonstrate interval reduction and improved alignment of the fractured fifth proximal phalanx.    IMPRESSION: Improved alignment.    XR Hand Left PA/Lat/Obl  Result Date: 08/22/2024  08/22/2024 10:35 AM CDT XR HAND LEFT PA LATERAL AND OBLIQUE History:  53 years  Female injury Reference None Findings 3 views of the left hand demonstrate a comminuted angulated fracture of the proximal aspect of the proximal  phalanx of the fifth digit. Soft tissue swelling is identified. No additional fracture identified.    IMPRESSION: Proximal phalanx fifth digit fracture    Assessment:   1. Injury of left little finger, initial encounter  -     XR HAND LEFT (MIN 3 VIEWS); Future  -     PR CAST SUP SHT ARM ADULT FBRGL  -     APPLY FOREARM CAST  2. Displaced fracture of proximal phalanx of left little finger, initial encounter for closed fracture  -     XR HAND LEFT (MIN 3 VIEWS); Future  -     PR CAST SUP SHT ARM ADULT FBRGL  -     APPLY FOREARM CAST       Plan:  Return for XOS Tuesday L little finger WC .   I did review repeat film at office today with the patient.  The reduction appears to be maintained.  However, we did review instability of fracture site with high risk for displacing further.  Recommend close observation.  We will proceed with buddy taping, ulnar gutter plaster splinting today.  Recommend return to clinic early next week for reevaluation and repeat imaging.  We discussed ice, rest, elevation, current pain control prescription as needed.  She may return to work on light duty with no use of the left hand.  Would consider surgical intervention if fracture site displaces further, remains unstable.  She expresses understanding and is in agreement with the plan.  All questions were answered.     Electronically signed  by Seena Mora, PA-C on 08/23/2024 at 11:39 AM

## 2024-08-23 NOTE — Progress Notes (Signed)
 Casting Notes:    Type of cast/splint: Arm, Forearm Splint Application     Material Used:  1. 3 rolls 2 inch cast padding      2.      3.    Fiberglass Cast Tape: 10 sheets 3x15 inch plaster     Reason for Application:     ICD-10-CM    1. Injury of left little finger, initial encounter  S69.92XA XR HAND LEFT (MIN 3 VIEWS)     PR CAST SUP SHT ARM ADULT FBRGL     APPLY FOREARM CAST      2. Displaced fracture of proximal phalanx of left little finger, initial encounter for closed fracture  S62.617A XR HAND LEFT (MIN 3 VIEWS)     PR CAST SUP SHT ARM ADULT FBRGL     APPLY FOREARM CAST      Patient was placed in a plaster ulnar gutter splint with the 4th and 5 th fingers buddy tapped with no complications .     Patient was given cast care instructions, and told to call the office with any complaints, or concerns.     Patient was also told to keep their routine follow up appointment.    Johnathan R Romain, MA

## 2024-08-29 ENCOUNTER — Encounter: Payer: BLUE CROSS/BLUE SHIELD | Primary: Family Medicine

## 2024-08-29 ENCOUNTER — Ambulatory Visit: Admit: 2024-08-29 | Discharge: 2024-09-06 | Payer: BLUE CROSS/BLUE SHIELD | Primary: Family Medicine

## 2024-08-29 ENCOUNTER — Ambulatory Visit: Admit: 2024-08-29 | Discharge: 2024-08-29 | Payer: PRIVATE HEALTH INSURANCE | Primary: Family Medicine

## 2024-08-29 VITALS — Ht 66.0 in | Wt 289.0 lb

## 2024-08-29 DIAGNOSIS — S62617D Displaced fracture of proximal phalanx of left little finger, subsequent encounter for fracture with routine healing: Principal | ICD-10-CM

## 2024-08-29 MED ORDER — HYDROCODONE-ACETAMINOPHEN 7.5-325 MG PO TABS
7.5-325 | ORAL_TABLET | Freq: Four times a day (QID) | ORAL | 0 refills | Status: AC | PRN
Start: 2024-08-29 — End: 2024-09-05

## 2024-08-29 NOTE — Progress Notes (Signed)
 Orthopaedic Clinic Note - Established Patient    NAME:  Evelyn Cordova   DOB: 1971/08/12  MRN: [id number]      08/29/2024      CHIEF COMPLAINT:  follow up for left little finger fracture/dislocation      HISTORY OF PRESENT ILLNESS:   The patient is a 53 y.o. right hand female who returns today for follow up of  left little finger injury she sustained at work 08/22/2024 that has been well documented in previous encounter.  Since last visit, pain has remained.     She has been nonweightbearing in buddy taping and a splint since last visit.     Past Medical History:        Diagnosis Date    Fractures     Ganglion cyst     Hypertension     Hypothyroidism        Past Surgical History:        Procedure Laterality Date    CESAREAN SECTION      CHOLECYSTECTOMY      HYSTERECTOMY (CERVIX STATUS UNKNOWN)         Current Medications:   Prior to Admission medications   Medication Sig Start Date End Date Taking? Authorizing Provider   HYDROcodone -acetaminophen  (NORCO) 5-325 MG per tablet Take 1 tablet by mouth every 6 hours as needed. 08/22/24 09/02/24 Yes [provider]   hydroxychloroquine (PLAQUENIL) 200 MG tablet Take by mouth 05/09/24  Yes [provider]   lisinopril (PRINIVIL;ZESTRIL) 40 MG tablet Take 1 tablet by mouth daily 02/07/24  Yes [provider]   levothyroxine (SYNTHROID) 150 MCG tablet Take 137 mcg by mouth Daily   Yes [provider]       Allergies:  Patient has no known allergies.    System Neg/Pos Details   Constitutional Negative Fatigue and Fever.   Respiratory Negative Cough and Dyspnea.   Cardio Negative Chest pain.   GI Negative Abdominal pain and Vomiting.   GU Negative Urinary incontinence.   Neuro Negative Headache.   Psych Negative Psychiatric symptoms.       PHYSICAL EXAM:    General:  Appears stated age, no distress.  Orientation:  Alert and oriented to time, place, and person.  Mood and Affect:  Cooperative and pleasant.  Musculoskeletal: Left hand exam:  Tenderness  about little finger proximal phalanx with diffuse soft tissue swelling and ecchymosis extending into palm.  She has full range of motion in first 3 digits.  No obvious extension lag in her ring or little fingers.  Flexion range of motion limited due to pain, effusion, apprehension.  No evidence of deviation, malrotation upon attempted range of motion.  Joints appear stable.  Neurovascular tact.  Skin intact without signs of infection.      Radiology:   XR FINGER LEFT (MIN 2 VIEWS)  Result Date: 08/29/2024  AP, lateral, and oblique views of the left little finger was obtained in the office on today's visit, reviewed and interpreted by me.  Images of adequate quality.  Images show displaced intra-articular proximal phalanx fracture near base of little finger with overlying soft tissue swelling noted. Bone quality appears normal.  No acute interval changes compared to previous imaging.      XR HAND LEFT (MIN 3 VIEWS)  Result Date: 08/23/2024  AP, lateral, and oblique views of the left hand was obtained in the office on today's visit, reviewed and interpreted by me.  Images of adequate quality. Images show displaced extra-articular  proximal phalanx fracture near base of little finger with overlying soft tissue swelling noted. Bone quality appears normal.      --------------------------------------------------------------------------------------------------------------------    Assessment:   Encounter Diagnoses   Name Primary?    Closed displaced fracture of proximal phalanx of left little finger with routine healing, subsequent encounter Yes    Injury of left little finger, subsequent encounter         Plan:    Return for XR 1 week L little finger WC .   Recommend continued close observation.  Recommend buddy taping, ice, elevation, pain medication as needed for swelling and pain control, further stabilization of the fracture site.  Recommend close observation with return to clinic in 1 week with repeat imaging given high  instability of fracture site.  She may continue to work on light duty with no use of left hand.  All questions were answered.     Electronically signed by Seena Mora, PA-C on 08/29/2024 at 4:28 PM

## 2024-08-29 NOTE — Progress Notes (Unsigned)
 Orthopaedic Clinic Note - Established Patient    NAME:  Evelyn Cordova   DOB: 22-Oct-1971  MRN: [id number]      08/29/2024      CHIEF COMPLAINT:  follow up for left little finger fracture/dislocation      HISTORY OF PRESENT ILLNESS:   The patient is a 53 y.o. right hand female who returns today for follow up of  left little finger injury she sustained at work 08/22/2024 that has been well documented in previous encounter.  Since last visit, pain has ***     She has been nonweightbearing in a splint since last visit.     Past Medical History:        Diagnosis Date    Fractures     Ganglion cyst     Hypertension     Hypothyroidism        Past Surgical History:        Procedure Laterality Date    CESAREAN SECTION      CHOLECYSTECTOMY      HYSTERECTOMY (CERVIX STATUS UNKNOWN)         Current Medications:   Prior to Admission medications   Medication Sig Start Date End Date Taking? Authorizing Provider   HYDROcodone -acetaminophen  (NORCO) 5-325 MG per tablet Take 1 tablet by mouth every 6 hours as needed. 08/22/24 09/02/24  [provider]   hydroxychloroquine (PLAQUENIL) 200 MG tablet Take by mouth 05/09/24   [provider]   lisinopril (PRINIVIL;ZESTRIL) 40 MG tablet Take 1 tablet by mouth daily 02/07/24   [provider]   levothyroxine (SYNTHROID) 150 MCG tablet Take 137 mcg by mouth Daily    [provider]       Allergies:  Patient has no known allergies.    System Neg/Pos Details   Constitutional Negative Fatigue and Fever.   Respiratory Negative Cough and Dyspnea.   Cardio Negative Chest pain.   GI Negative Abdominal pain and Vomiting.   GU Negative Urinary incontinence.   Neuro Negative Headache.   Psych Negative Psychiatric symptoms.       PHYSICAL EXAM:    General:  Appears stated age, no distress.  Orientation:  Alert and oriented to time, place, and person.  Mood and Affect:  Cooperative and pleasant.  Musculoskeletal: Left hand exam:        Radiology:      --------------------------------------------------------------------------------------------------------------------    Assessment:   No diagnosis found.     Plan:    No follow-ups on file.        Electronically signed by Seena Mora, PA-C on 08/28/2024 at 9:10 AM

## 2024-09-05 ENCOUNTER — Ambulatory Visit: Admit: 2024-09-05 | Discharge: 2024-09-13 | Payer: BLUE CROSS/BLUE SHIELD | Primary: Family Medicine

## 2024-09-05 ENCOUNTER — Encounter

## 2024-09-05 ENCOUNTER — Ambulatory Visit: Admit: 2024-09-05 | Discharge: 2024-09-05 | Payer: BLUE CROSS/BLUE SHIELD | Primary: Family Medicine

## 2024-09-05 VITALS — Ht 66.0 in | Wt 289.0 lb

## 2024-09-05 DIAGNOSIS — S62617D Displaced fracture of proximal phalanx of left little finger, subsequent encounter for fracture with routine healing: Principal | ICD-10-CM

## 2024-09-05 NOTE — Progress Notes (Signed)
 Orthopaedic Clinic Note - Established Patient    NAME:  Evelyn Cordova   DOB: 04/29/1971  MRN: [id number]    09/05/2024    CHIEF COMPLAINT:  follow up for left little finger fracture/dislocation    HISTORY OF PRESENT ILLNESS:   The patient is a 53 y.o. right hand female who returns today for follow up of  left little finger injury she sustained at work 08/22/2024 that has been well documented in previous encounter.  Since last visit, pain has remained.     She has been nonweightbearing in buddy taping since last visit.     Past Medical History:        Diagnosis Date    Fractures     Ganglion cyst     Hypertension     Hypothyroidism        Past Surgical History:        Procedure Laterality Date    CESAREAN SECTION      CHOLECYSTECTOMY      HYSTERECTOMY (CERVIX STATUS UNKNOWN)         Current Medications:   Prior to Admission medications   Medication Sig Start Date End Date Taking? Authorizing Provider   HYDROcodone -acetaminophen  (NORCO) 7.5-325 MG per tablet Take 1 tablet by mouth every 6 hours as needed for Pain for up to 7 days. Take one (1) tablet by oral route every 6 hours as needed for pain Max Daily Amount: 4 tablets 08/29/24 09/05/24 Yes Patton, Jason G, MD   hydroxychloroquine (PLAQUENIL) 200 MG tablet Take by mouth 05/09/24  Yes [provider]   lisinopril (PRINIVIL;ZESTRIL) 40 MG tablet Take 1 tablet by mouth daily 02/07/24  Yes [provider]   levothyroxine (SYNTHROID) 150 MCG tablet Take 137 mcg by mouth Daily   Yes [provider]       Allergies:  Patient has no known allergies.    System Neg/Pos Details   Constitutional Negative Fatigue and Fever.   Respiratory Negative Cough and Dyspnea.   Cardio Negative Chest pain.   GI Negative Abdominal pain and Vomiting.   GU Negative Urinary incontinence.   Neuro Negative Headache.   Psych Negative Psychiatric symptoms.       PHYSICAL EXAM:    General:  Appears stated age, no distress.  Orientation:  Alert and oriented to time, place, and  person.  Mood and Affect:  Cooperative and pleasant.  Musculoskeletal: Left hand exam:  Tenderness about little finger proximal phalanx with diffuse soft tissue swelling and ecchymosis extending into palm.  She has full range of motion in first 3 digits.  No obvious extension lag in her ring or little fingers.  Flexion range of motion limited due to pain, effusion, apprehension.  No evidence of deviation, malrotation upon attempted range of motion.  Joints appear stable.  Neurovascular tact.  Skin intact without signs of infection.    Radiology:   XR FINGER LEFT (MIN 2 VIEWS)  Result Date: 09/05/2024  AP, lateral, and oblique views of the left little finger was obtained in the office on today's visit, reviewed and interpreted by me.  Images of adequate quality. Images show displaced intra-articular proximal phalanx fracture near base of little finger. No acute interval changes compared to previous imaging.   --------------------------------------------------------------------------------------------------------------------    Assessment:   Encounter Diagnoses   Name Primary?    Closed displaced fracture of proximal phalanx of left little finger with routine healing, subsequent encounter Yes    Injury of left little finger, subsequent  encounter           Plan:    Return for XR 1 week L little finger w/ [place], [place], or [place].   Recommend continued close observation.  Recommend buddy taping, ice, elevation, pain medication as needed for swelling and pain control, further stabilization of the fracture site.  Will initiate a hand therapy order downstairs.  Recommend close observation with return to clinic in 1 week with repeat imaging given high instability of fracture site.  She may continue to work on light duty with no use of left hand.  All questions were answered.     Electronically signed by Seena Mora, PA-C on 09/05/2024 at 4:37 PM

## 2024-09-12 ENCOUNTER — Encounter: Payer: BLUE CROSS/BLUE SHIELD | Attending: Physician Assistant | Primary: Family Medicine

## 2024-09-13 ENCOUNTER — Ambulatory Visit
Admit: 2024-09-13 | Discharge: 2024-09-13 | Payer: PRIVATE HEALTH INSURANCE | Attending: Physician Assistant | Primary: Family Medicine

## 2024-09-13 ENCOUNTER — Ambulatory Visit: Admit: 2024-09-13 | Payer: BLUE CROSS/BLUE SHIELD | Primary: Family Medicine

## 2024-09-13 VITALS — Ht 66.0 in | Wt 290.0 lb

## 2024-09-13 DIAGNOSIS — S62617D Displaced fracture of proximal phalanx of left little finger, subsequent encounter for fracture with routine healing: Principal | ICD-10-CM

## 2024-09-13 NOTE — Progress Notes (Signed)
 "Orthopaedic Clinic Note - Established Patient    NAME:  Evelyn Cordova   DOB: 1971-03-09  MRN: [id number]      09/13/2024      CHIEF COMPLAINT:  follow up for left little finger fracture/dislocation     HISTORY OF PRESENT ILLNESS:   The patient is a 53 y.o. right hand female who returns today for follow up of  left little finger injury she sustained at work 08/22/2024 that has been well documented in previous encounter.  Since last visit, pain has remained.     She has been nonweightbearing in buddy taping since last visit.     The above note copied from date of service 09/05/2024:    This is a pleasant 53 year old female who comes in for follow-up for a left fifth proximal phalanx fracture.  This is work-related.  Date of injury 08/22/2024.  Patient's had her fingers buddy taped.  No use of her left upper extremity.  States pain and her range of motion is improving.    Past Medical History:        Diagnosis Date    Fractures     Ganglion cyst     Hypertension     Hypothyroidism        Past Surgical History:        Procedure Laterality Date    CESAREAN SECTION      CHOLECYSTECTOMY      HYSTERECTOMY (CERVIX STATUS UNKNOWN)         Current Medications:   Prior to Admission medications   Medication Sig Start Date End Date Taking? Authorizing Provider   hydroxychloroquine (PLAQUENIL) 200 MG tablet Take by mouth 05/09/24   [provider]   lisinopril (PRINIVIL;ZESTRIL) 40 MG tablet Take 1 tablet by mouth daily 02/07/24   [provider]   levothyroxine (SYNTHROID) 150 MCG tablet Take 137 mcg by mouth Daily    [provider]       Allergies: Patient has no known allergies.    System Neg/Pos Details   Constitutional negative   Fatigue and Fever.   Respiratory negative   Cough and Dyspnea.   Cardio negative   Chest pain.   GI negative   Abdominal pain and Vomiting.   GU negative   Urinary incontinence.   Neuro negative   Headache.   Psych negative   Psychiatric symptoms.       PHYSICAL EXAM:    Ht 1.676 m  (5' 6)   Wt 131.5 kg (290 lb)   BMI 46.81 kg/m     General:  Appears stated age, no distress.  Orientation:  Alert and oriented to time, place, and person.  Mood and Affect:  Cooperative and pleasant.    Musculoskeletal:  Patient is a able to fully make a fist.  She is intact with full flexion and extension is resistance.  Neuro vas tact distally.    Radiology:   XR FINGER LEFT (MIN 2 VIEWS)  Result Date: 09/13/2024  3 views of the left finger were done here today to include AP lateral and oblique.  These were adequate and interpreted by me.  These were also compared to x-rays done 1 week prior.  Palmar displacement of the proximal phalanx fracture noted but when compared to x-rays done 7 days ago the images appear stable.       XR Hand Left PA/Lat/Obl  Result Date: 08/22/2024  IMPRESSION: Improved alignment.    XR Hand Left PA/Lat/Obl  Result Date: 08/22/2024  IMPRESSION: Proximal phalanx fifth digit fracture       MRI Result (most recent):  No results found for this or any previous visit from the past 3650 days.       --------------------------------------------------------------------------------------------------------------------    Assessment:   Closed, displaced left fifth proximal phalanx fracture status post workplace injury    Plan:    She will continue with her fingers buddy tape.  I have gone over some range of motion activities for her to do while at work and at home.  She will follow-up in 2 weeks for repeat x-rays.        No follow-ups on file.   Orders Placed This Encounter    XR FINGER LEFT (MIN 2 VIEWS)     Standing Status:   Future     Number of Occurrences:   1     Expected Date:   09/13/2024     Expiration Date:   09/12/2025     Specify which digit to image:   Fifth         Electronically signed by Morene SHAUNNA Daring, PA-C on 09/13/2024 at 9:40 AM.    [employer] Disclaimer:   This note was dictated with voice recognition software.  Though review and corrections are routine, we apologize for any  errors.  "

## 2024-09-19 ENCOUNTER — Encounter: Payer: BLUE CROSS/BLUE SHIELD | Primary: Family Medicine

## 2024-09-27 ENCOUNTER — Encounter: Payer: BLUE CROSS/BLUE SHIELD | Primary: Family Medicine

## 2024-10-02 ENCOUNTER — Ambulatory Visit: Admit: 2024-10-02 | Discharge: 2024-10-09 | Payer: BLUE CROSS/BLUE SHIELD | Primary: Family Medicine

## 2024-10-02 ENCOUNTER — Ambulatory Visit: Admit: 2024-10-02 | Discharge: 2024-10-02 | Payer: BLUE CROSS/BLUE SHIELD | Primary: Family Medicine

## 2024-10-02 VITALS — Ht 66.0 in | Wt 290.0 lb

## 2024-10-02 DIAGNOSIS — S62617D Displaced fracture of proximal phalanx of left little finger, subsequent encounter for fracture with routine healing: Principal | ICD-10-CM

## 2024-10-02 NOTE — Progress Notes (Signed)
 "   Orthopaedic Clinic Note - Established Patient    NAME:  Evelyn Cordova   DOB: 07-30-1971  MRN: [id number]      10/02/2024      CHIEF COMPLAINT:  follow up for left little finger fracture/dislocation      HISTORY OF PRESENT ILLNESS:   The patient is a 53 y.o. female who returns today for follow up of  left little finger fracture/dislocation from a work injury 08/22/2024 that has been well documented in previous encounters.  She has been compliant with buddy taping, Since last visit, pain and range of motion have continued to improve.       Past Medical History:        Diagnosis Date    Fractures     Ganglion cyst     Hypertension     Hypothyroidism        Past Surgical History:        Procedure Laterality Date    CESAREAN SECTION      CHOLECYSTECTOMY      HYSTERECTOMY (CERVIX STATUS UNKNOWN)         Current Medications:   Prior to Admission medications   Medication Sig Start Date End Date Taking? Authorizing Provider   hydroxychloroquine (PLAQUENIL) 200 MG tablet Take by mouth 05/09/24  Yes [provider]   lisinopril (PRINIVIL;ZESTRIL) 40 MG tablet Take 1 tablet by mouth daily 02/07/24  Yes [provider]   levothyroxine (SYNTHROID) 150 MCG tablet Take 137 mcg by mouth Daily   Yes [provider]       Allergies:  Patient has no known allergies.    System Neg/Pos Details   Constitutional Negative Fatigue and Fever.   Respiratory Negative Cough and Dyspnea.   Cardio Negative Chest pain.   GI Negative Abdominal pain and Vomiting.   GU Negative Urinary incontinence.   Neuro Negative Headache.   Psych Negative Psychiatric symptoms.       PHYSICAL EXAM:    General:  Appears stated age, no distress.  Orientation:  Alert and oriented to time, place, and person.  Mood and Affect:  Cooperative and pleasant.  Musculoskeletal: Left hand exam:  Tenderness at the base of the proximal phalanx of the little finger.  Improvement in soft tissue swelling and ecchymosis.  She is able to achieve reasonable good  extension without lag.  Lacks a few degrees from full flexion involving PIP and the little finger.  Unable to make a full composite fist involving little finger.  She is able to perform thumb finger opposition testing score 8.  No evidence of deviation, malrotation or scissoring upon range of motion testing.  Strength testing deferred.  Joints appear stable.  Neurovascular intact.  Skin intact without signs of infection.      Radiology:   XR FINGER LEFT (MIN 2 VIEWS)  Result Date: 10/02/2024  AP, lateral, and oblique views of the left little finger was obtained in the office on today's visit, reviewed and interpreted by me.  Images of adequate quality.  Images show stable appearing, displaced proximal phalanx base fracture with intra-articular involvement.  There is some early surrounding callus seen compared to previous imaging.        --------------------------------------------------------------------------------------------------------------------    Assessment:   Encounter Diagnoses   Name Primary?    Closed displaced fracture of proximal phalanx of left little finger with routine healing, subsequent encounter Yes    Injury of left little finger, subsequent encounter  Plan:    Return for XR 3 weeks WC L little finger fracture .   Recommend continued conservative management with serial x-ray imaging.  She will continue with buddy taping with activity, at work, out in public for protection.  She may remove buddy taping when sedentary and continue working on range of motion independently.  She declines a formal hand therapy order today.  We did discuss physician guided stretches and exercises that she will perform independently.  She may continue to work on modified duty, no use of left hand at this time.  Recommend return to clinic in 3 weeks for reevaluation and repeat imaging.  All questions were answered.     Electronically signed by Seena Mora, PA-C on 10/02/2024 at 4:26 PM    "

## 2024-10-23 ENCOUNTER — Encounter: Payer: BLUE CROSS/BLUE SHIELD | Primary: Family Medicine

## 2024-10-23 DIAGNOSIS — S62617D Displaced fracture of proximal phalanx of left little finger, subsequent encounter for fracture with routine healing: Principal | ICD-10-CM

## 2024-10-23 NOTE — Progress Notes (Deleted)
"     Orthopaedic Clinic Note - Established Patient    NAME:  Evelyn Cordova   DOB: 10/16/1971  MRN: [id number]      10/23/2024      CHIEF COMPLAINT:  follow up for left little finger fracture/dislocation      HISTORY OF PRESENT ILLNESS:   The patient is a 53 y.o. female who returns today for follow up of  left little finger fracture/dislocation from a work injury 08/22/2024 that has been well documented in previous encounters.  She has been compliant with buddy taping, Since last visit, pain and range of motion have continued to improve.       Past Medical History:        Diagnosis Date    Fractures     Ganglion cyst     Hypertension     Hypothyroidism        Past Surgical History:        Procedure Laterality Date    CESAREAN SECTION      CHOLECYSTECTOMY      HYSTERECTOMY (CERVIX STATUS UNKNOWN)         Current Medications:   Prior to Admission medications   Medication Sig Start Date End Date Taking? Authorizing Provider   hydroxychloroquine (PLAQUENIL) 200 MG tablet Take by mouth 05/09/24   [provider]   lisinopril (PRINIVIL;ZESTRIL) 40 MG tablet Take 1 tablet by mouth daily 02/07/24   [provider]   levothyroxine (SYNTHROID) 150 MCG tablet Take 137 mcg by mouth Daily    [provider]       Allergies:  Patient has no known allergies.    System Neg/Pos Details   Constitutional Negative Fatigue and Fever.   Respiratory Negative Cough and Dyspnea.   Cardio Negative Chest pain.   GI Negative Abdominal pain and Vomiting.   GU Negative Urinary incontinence.   Neuro Negative Headache.   Psych Negative Psychiatric symptoms.       PHYSICAL EXAM:    General:  Appears stated age, no distress.  Orientation:  Alert and oriented to time, place, and person.  Mood and Affect:  Cooperative and pleasant.  Musculoskeletal: Left hand exam:  Tenderness at the base of the proximal phalanx of the little finger.  Improvement in soft tissue swelling and ecchymosis.  She is able to achieve reasonable good extension  without lag.  Lacks a few degrees from full flexion involving PIP and the little finger.  Unable to make a full composite fist involving little finger.  She is able to perform thumb finger opposition testing score 8.  No evidence of deviation, malrotation or scissoring upon range of motion testing.  Strength testing deferred.  Joints appear stable.  Neurovascular intact.  Skin intact without signs of infection.      Radiology:     --------------------------------------------------------------------------------------------------------------------    Assessment:   No diagnosis found.       Plan:    No follow-ups on file.        Electronically signed by Seena Mora, PA-C on 10/19/2024 at 12:41 PM    "

## 2024-10-26 ENCOUNTER — Encounter: Payer: PRIVATE HEALTH INSURANCE | Primary: Family Medicine

## 2024-10-26 NOTE — Progress Notes (Deleted)
"     Orthopaedic Clinic Note - Established Patient    NAME:  Evelyn Cordova   DOB: [birth year]  MRN: [id number]      10/26/2024      CHIEF COMPLAINT:  follow up for left little finger fracture/dislocation      HISTORY OF PRESENT ILLNESS:   The patient is a 53 y.o. female who returns today for follow up of  left little finger fracture/dislocation from a work injury 08/22/2024 that has been well documented in previous encounters.  She has been compliant with buddy taping, Since last visit, pain and range of motion have continued to improve.       Past Medical History:        Diagnosis Date    Fractures     Ganglion cyst     Hypertension     Hypothyroidism        Past Surgical History:        Procedure Laterality Date    CESAREAN SECTION      CHOLECYSTECTOMY      HYSTERECTOMY (CERVIX STATUS UNKNOWN)         Current Medications:   Prior to Admission medications   Medication Sig Start Date End Date Taking? Authorizing Provider   hydroxychloroquine (PLAQUENIL) 200 MG tablet Take by mouth 05/09/24   [provider]   lisinopril (PRINIVIL;ZESTRIL) 40 MG tablet Take 1 tablet by mouth daily 02/07/24   [provider]   levothyroxine (SYNTHROID) 150 MCG tablet Take 137 mcg by mouth Daily    [provider]       Allergies:  Patient has no known allergies.    System Neg/Pos Details   Constitutional Negative Fatigue and Fever.   Respiratory Negative Cough and Dyspnea.   Cardio Negative Chest pain.   GI Negative Abdominal pain and Vomiting.   GU Negative Urinary incontinence.   Neuro Negative Headache.   Psych Negative Psychiatric symptoms.       PHYSICAL EXAM:    General:  Appears stated age, no distress.  Orientation:  Alert and oriented to time, place, and person.  Mood and Affect:  Cooperative and pleasant.  Musculoskeletal: Left hand exam:  Tenderness at the base of the proximal phalanx of the little finger.  Improvement in soft tissue swelling and ecchymosis.  She is able to achieve reasonable good extension  without lag.  Lacks a few degrees from full flexion involving PIP and the little finger.  Unable to make a full composite fist involving little finger.  She is able to perform thumb finger opposition testing score 8.  No evidence of deviation, malrotation or scissoring upon range of motion testing.  Strength testing deferred.  Joints appear stable.  Neurovascular intact.  Skin intact without signs of infection.      Radiology:     --------------------------------------------------------------------------------------------------------------------    Assessment:   No diagnosis found.       Plan:    No follow-ups on file.        Electronically signed by Seena Mora, PA-C on 10/25/2024 at 1:49 PM    "
